# Patient Record
Sex: Female | Born: 1970 | Race: Black or African American | Hispanic: No | Marital: Single | Smoking: Never smoker
Health system: Southern US, Community
[De-identification: ages and names within clinical notes are randomized; demographics above are authoritative.]

---

## 2017-05-21 ENCOUNTER — Emergency Department
Admission: EM | Admit: 2017-05-21 | Discharge: 2017-05-21 | Disposition: A | Discharge status: Home / Self Care | Payer: Worker's Compensation | Attending: Emergency Medicine | Admitting: Emergency Medicine

## 2017-05-21 ENCOUNTER — Emergency Department: Payer: Worker's Compensation

## 2017-05-21 ENCOUNTER — Encounter: Payer: Self-pay | Admitting: Emergency Medicine

## 2017-05-21 DIAGNOSIS — S92334A Nondisplaced fracture of third metatarsal bone, right foot, initial encounter for closed fracture: Secondary | ICD-10-CM | POA: Insufficient documentation

## 2017-05-21 DIAGNOSIS — Y9289 Other specified places as the place of occurrence of the external cause: Secondary | ICD-10-CM | POA: Insufficient documentation

## 2017-05-21 DIAGNOSIS — Y990 Civilian activity done for income or pay: Secondary | ICD-10-CM | POA: Diagnosis not present

## 2017-05-21 DIAGNOSIS — W208XXA Other cause of strike by thrown, projected or falling object, initial encounter: Secondary | ICD-10-CM | POA: Insufficient documentation

## 2017-05-21 DIAGNOSIS — Y939 Activity, unspecified: Secondary | ICD-10-CM | POA: Insufficient documentation

## 2017-05-21 DIAGNOSIS — S99921A Unspecified injury of right foot, initial encounter: Secondary | ICD-10-CM | POA: Diagnosis present

## 2017-05-21 MED ORDER — MELOXICAM 7.5 MG PO TABS: 7.5000 mg | ORAL_TABLET | Freq: Every day | ORAL | 1 refills | Status: AC

## 2017-05-21 MED ORDER — ONDANSETRON 4 MG PO TBDP
4.0000 mg | ORAL_TABLET | Freq: Once | ORAL | Status: AC
  Administered 2017-05-21: 4 mg via ORAL
  Filled 2017-05-21: qty 1

## 2017-05-21 MED ORDER — OXYCODONE-ACETAMINOPHEN 5-325 MG PO TABS
1.0000 | ORAL_TABLET | Freq: Once | ORAL | Status: AC
  Administered 2017-05-21: 1 via ORAL
  Filled 2017-05-21: qty 1

## 2017-05-21 MED ORDER — OXYCODONE-ACETAMINOPHEN 5-325 MG PO TABS
1.0000 | ORAL_TABLET | ORAL | 0 refills | Status: AC | PRN
Start: 2017-05-21 — End: 2017-05-24

## 2017-05-21 NOTE — ED Triage Notes (Signed)
Pt c/o right foot pain. Was at work at [employer] and foot got stuck under a jack with full pallet on it.  Swelling and bruising noted to right foot. Pt also reports right knee pain

## 2017-05-21 NOTE — ED Notes (Signed)
Splint applied good pms before and after splinting

## 2017-05-21 NOTE — ED Notes (Signed)
Spoke with pt supervisor/ team leader who waived doing UDS as profile in [facility] system indicates pt supervisor name Edi

## 2017-05-21 NOTE — ED Provider Notes (Signed)
[facility] Emergency Department Provider Note  ____________________________________________  Time seen: Approximately 2:17 PM  I have reviewed the triage vital signs and the nursing notes.   HISTORY  Chief Complaint Foot Pain   HPI Gina Skinner is a 46 y.o. female presenting to the emergency department with 10 out of 10 right foot pain after patient had a pallet drop on her foot at work today. Patient denies weakness, radiculopathy or changes in sensation of the right foot. No prior traumas or surgeries to the right foot. No alleviating measures have been attempted.   History reviewed. No pertinent past medical history.  There are no active problems to display for this patient.   Past Surgical History:  Procedure Laterality Date  . CESAREAN SECTION      Prior to Admission medications   Medication Sig Start Date End Date Taking? Authorizing Provider  meloxicam (MOBIC) 7.5 MG tablet Take 1 tablet (7.5 mg total) by mouth daily. 05/21/17 05/28/17  Orvil Feil, PA-C  oxyCODONE-acetaminophen (ROXICET) 5-325 MG tablet Take 1 tablet by mouth every 4 (four) hours as needed for severe pain. 05/21/17 05/24/17  Orvil Feil, PA-C    Allergies Patient has no known allergies.  History reviewed. No pertinent family history.  Social History Social History  Substance Use Topics  . Smoking status: Never Smoker  . Smokeless tobacco: Never Used  . Alcohol use Yes     Review of Systems  Constitutional: No fever/chills Eyes: No visual changes. No discharge ENT: No upper respiratory complaints. Cardiovascular: no chest pain. Respiratory: no cough. No SOB. Musculoskeletal: Patient has right foot pain.  Skin: Negative for rash, abrasions, lacerations, ecchymosis. Neurological: Negative for headaches, focal weakness or numbness.   ____________________________________________   PHYSICAL EXAM:  VITAL SIGNS: ED Triage Vitals  Enc Vitals Group     BP  05/21/17 1323 (!) 151/74     Pulse Rate 05/21/17 1323 92     Resp 05/21/17 1323 18     Temp 05/21/17 1323 98 F (36.7 C)     Temp Source 05/21/17 1323 Oral     SpO2 05/21/17 1323 100 %     Weight 05/21/17 1325 150 lb (68 kg)     Height 05/21/17 1325 5\' 5"  (1.651 m)     Head Circumference --      Peak Flow --      Pain Score 05/21/17 1323 10     Pain Loc --      Pain Edu? --      Excl. in GC? --      Constitutional: Alert and oriented. Well appearing and in no acute distress. Eyes: Conjunctivae are normal. PERRL. EOMI. Head: Atraumatic. Cardiovascular: Normal rate, regular rhythm. Normal S1 and S2.  Good peripheral circulation. Respiratory: Normal respiratory effort without tachypnea or retractions. Lungs CTAB. Good air entry to the bases with no decreased or absent breath sounds. Musculoskeletal: Patient has 5 out of 5 strength in the lower extremities bilaterally. Patient is able to perform plantar flexion and dorsi flexion at the right ankle. Patient is able to move all 5 right toes. Patient has tenderness to palpation along the third and fourth metatarsals. Palpable dorsalis pedis pulse bilaterally and symmetrically. Neurologic:  Normal speech and language. No gross focal neurologic deficits are appreciated.  Skin:  Skin is warm, dry and intact. No rash noted. Psychiatric: Mood and affect are normal. Speech and behavior are normal. Patient exhibits appropriate insight and judgement.   ____________________________________________  LABS (all labs ordered are listed, but only abnormal results are displayed)  Labs Reviewed - No data to display ____________________________________________  EKG   ____________________________________________  RADIOLOGY Geraldo PitterI, Jaclyn M Woods, personally viewed and evaluated these images (plain radiographs) as part of my medical decision making, as well as reviewing the written report by the radiologist.  Dg Foot Complete Right  Result Date:  05/21/2017 CLINICAL DATA:  Injury. EXAM: RIGHT FOOT COMPLETE - 3+ VIEW COMPARISON:  No recent prior . FINDINGS: Nondisplaced fracture of the base of the right third metatarsal noted. Nondisplaced fracture of the base of the right fourth metatarsal cannot be excluded. No other focal abnormalities identified. IMPRESSION: Nondisplaced fracture at the base of the right third metatarsal. Nondisplaced fracture at of the base of the right fourth metatarsal cannot be excluded . Electronically Signed   By: Maisie Fushomas  Register   On: 05/21/2017 13:40    ____________________________________________    PROCEDURES  Procedure(s) performed:    Procedures    Medications  oxyCODONE-acetaminophen (PERCOCET/ROXICET) 5-325 MG per tablet 1 tablet (1 tablet Oral Given 05/21/17 1422)  ondansetron (ZOFRAN-ODT) disintegrating tablet 4 mg (4 mg Oral Given 05/21/17 1422)     ____________________________________________   INITIAL IMPRESSION / ASSESSMENT AND PLAN / ED COURSE  Pertinent labs & imaging results that were available during my care of the patient were reviewed by me and considered in my medical decision making (see chart for details).  Review of the [place] CSRS was performed in accordance of the [facility] prior to dispensing any controlled drugs.     Assessment and plan: Right foot pain: Patient presents to the emergency department with right foot pain after a pallet fell on patient's right foot. X-ray examination conducted in the emergency department revealed a third metatarsal fracture. A splint was applied in the emergency department and Roxicet was given for pain. Patient was discharged with Roxicet. A referral was given to podiatry, Dr. Orland Jarredroxler. All patient questions were answered.   ____________________________________________  FINAL CLINICAL IMPRESSION(S) / ED DIAGNOSES  Final diagnoses:  Closed nondisplaced fracture of third metatarsal bone of right foot, initial encounter      NEW MEDICATIONS  STARTED DURING THIS VISIT:  New Prescriptions   MELOXICAM (MOBIC) 7.5 MG TABLET    Take 1 tablet (7.5 mg total) by mouth daily.   OXYCODONE-ACETAMINOPHEN (ROXICET) 5-325 MG TABLET    Take 1 tablet by mouth every 4 (four) hours as needed for severe pain.        This chart was dictated using voice recognition software/Dragon. Despite best efforts to proofread, errors can occur which can change the meaning. Any change was purely unintentional.    Orvil FeilWoods, Jaclyn M, PA-C 05/21/17 1431    Sharman CheekStafford, Phillip, MD 05/24/17 [phone]

## 2018-12-18 IMAGING — DX DG FOOT COMPLETE 3+V*R*
3 series · 3 of 3 positions shown · non-contrast
Comparison: No recent prior .

CLINICAL DATA: Injury.

EXAM:
RIGHT FOOT COMPLETE - 3+ VIEW

[foot ap]
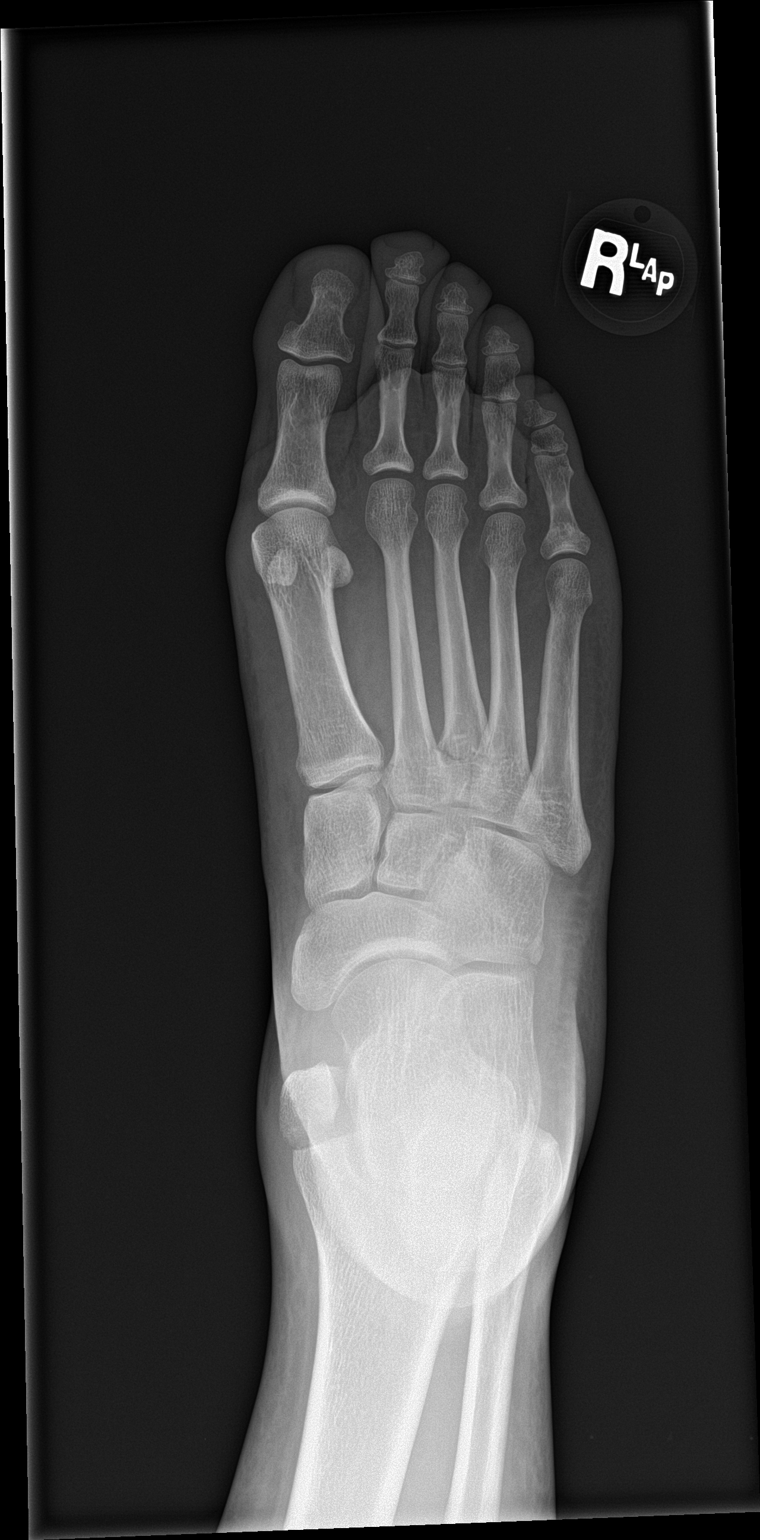

[foot obl]
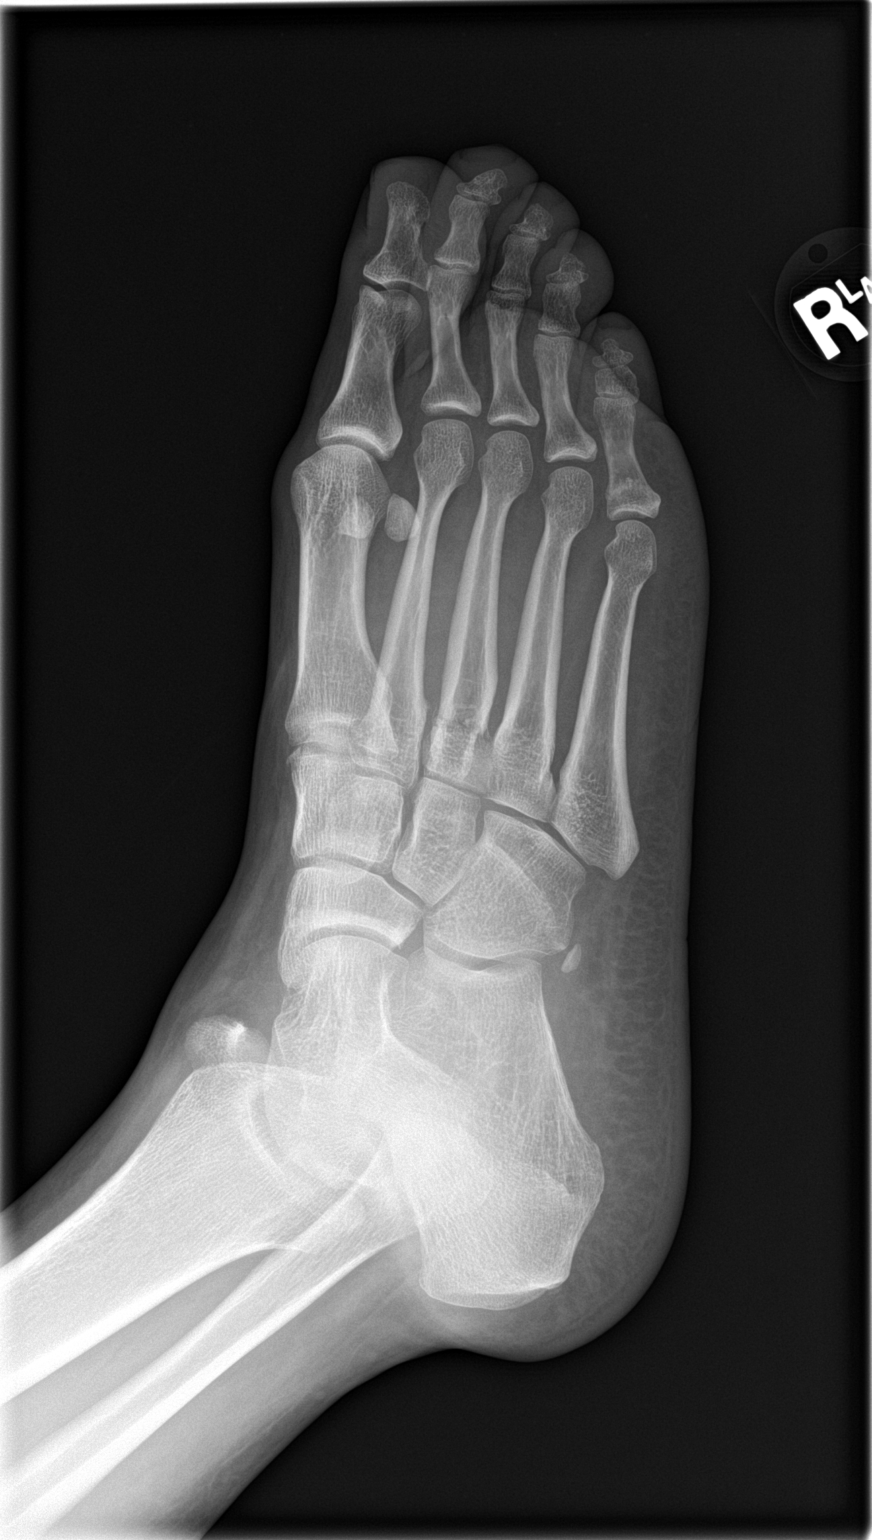

[foot lat]
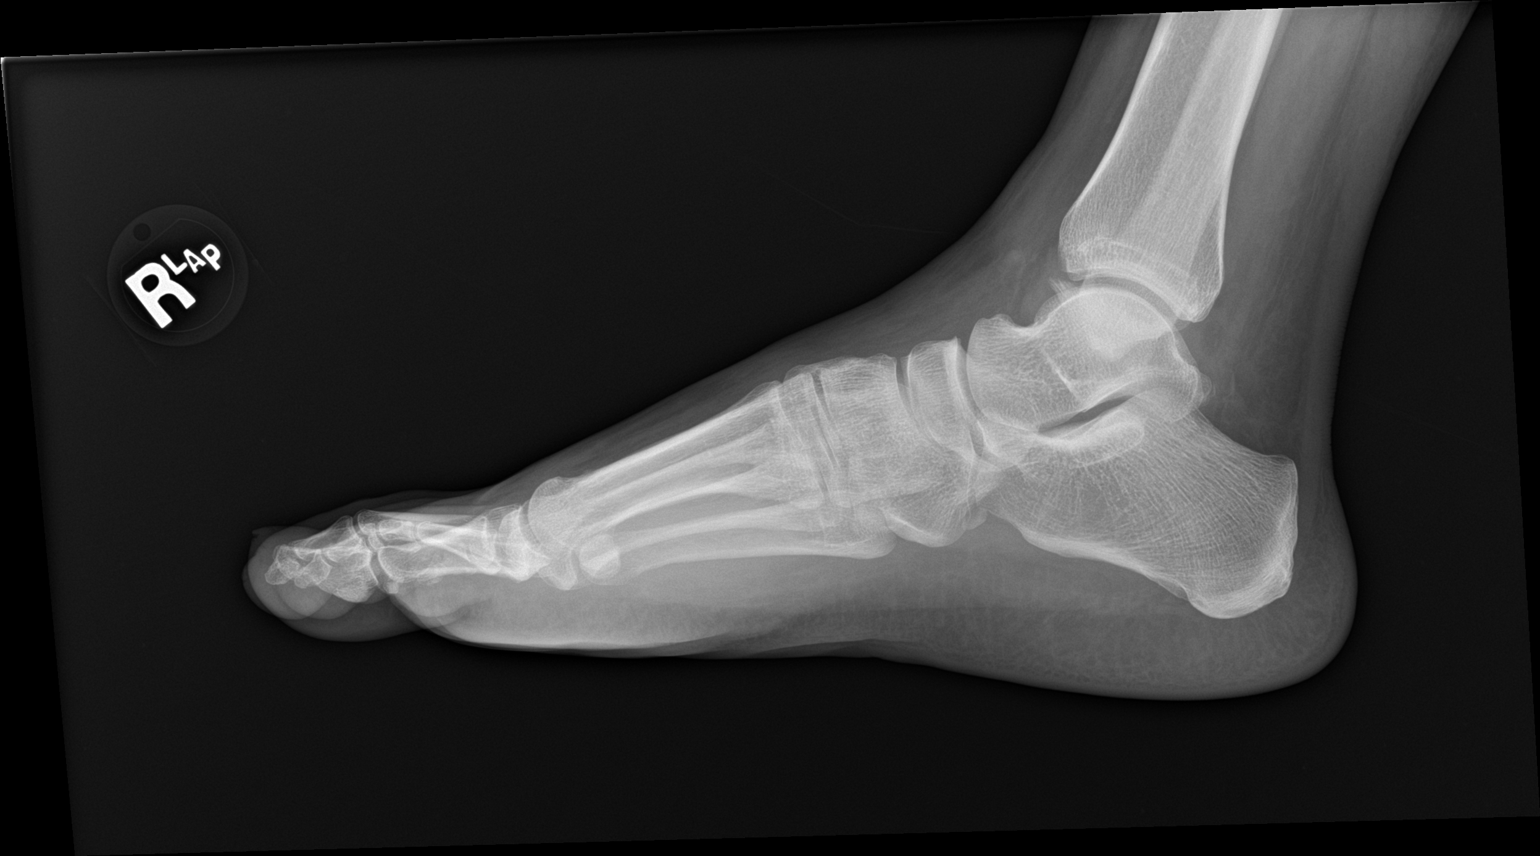

[3 of 3 positions shown; findings below may reference images not displayed]

FINDINGS: Nondisplaced fracture of the base of the right third metatarsal
noted. Nondisplaced fracture of the base of the right fourth
metatarsal cannot be excluded. No other focal abnormalities
identified.
IMPRESSION: Nondisplaced fracture at the base of the right third metatarsal.
Nondisplaced fracture at of the base of the right fourth metatarsal
cannot be excluded .
